# Patient Record
Sex: Female | Born: 1970 | Race: Asian | Hispanic: No | Marital: Married | Smoking: Never smoker
Health system: Southern US, Community
[De-identification: ages and names within clinical notes are randomized; demographics above are authoritative.]

---

## 1998-06-14 ENCOUNTER — Encounter: Admission: RE | Admit: 1998-06-14 | Discharge: 1998-06-14 | Payer: Self-pay | Admitting: Family Medicine

## 1998-11-16 ENCOUNTER — Encounter: Admission: RE | Admit: 1998-11-16 | Discharge: 1998-11-16 | Payer: Self-pay | Admitting: Family Medicine

## 1999-02-04 ENCOUNTER — Encounter: Payer: Self-pay | Admitting: Orthopedic Surgery

## 1999-02-04 ENCOUNTER — Ambulatory Visit (HOSPITAL_COMMUNITY): Admission: RE | Admit: 1999-02-04 | Discharge: 1999-02-04 | Payer: Self-pay | Admitting: Orthopedic Surgery

## 1999-03-12 ENCOUNTER — Encounter: Admission: RE | Admit: 1999-03-12 | Discharge: 1999-03-12 | Payer: Self-pay | Admitting: Sports Medicine

## 1999-03-13 ENCOUNTER — Encounter: Admission: RE | Admit: 1999-03-13 | Discharge: 1999-03-13 | Payer: Self-pay | Admitting: Family Medicine

## 1999-03-26 ENCOUNTER — Encounter: Admission: RE | Admit: 1999-03-26 | Discharge: 1999-03-26 | Payer: Self-pay | Admitting: Family Medicine

## 1999-05-09 ENCOUNTER — Encounter: Admission: RE | Admit: 1999-05-09 | Discharge: 1999-05-09 | Payer: Self-pay | Admitting: Family Medicine

## 1999-05-21 ENCOUNTER — Encounter: Admission: RE | Admit: 1999-05-21 | Discharge: 1999-05-21 | Payer: Self-pay | Admitting: Sports Medicine

## 1999-06-14 ENCOUNTER — Encounter: Admission: RE | Admit: 1999-06-14 | Discharge: 1999-06-14 | Payer: Self-pay | Admitting: Family Medicine

## 1999-08-26 ENCOUNTER — Encounter: Admission: RE | Admit: 1999-08-26 | Discharge: 1999-08-26 | Payer: Self-pay | Admitting: Family Medicine

## 1999-12-25 ENCOUNTER — Other Ambulatory Visit: Admission: RE | Admit: 1999-12-25 | Discharge: 1999-12-25 | Payer: Self-pay | Admitting: Obstetrics and Gynecology

## 2000-02-17 ENCOUNTER — Other Ambulatory Visit: Admission: RE | Admit: 2000-02-17 | Discharge: 2000-02-17 | Payer: Self-pay | Admitting: Obstetrics and Gynecology

## 2000-02-17 ENCOUNTER — Encounter (INDEPENDENT_AMBULATORY_CARE_PROVIDER_SITE_OTHER): Payer: Self-pay

## 2000-06-10 ENCOUNTER — Encounter: Admission: RE | Admit: 2000-06-10 | Discharge: 2000-06-10 | Payer: Self-pay | Admitting: Family Medicine

## 2000-06-13 ENCOUNTER — Emergency Department (HOSPITAL_COMMUNITY): Admission: EM | Admit: 2000-06-13 | Discharge: 2000-06-13 | Payer: Self-pay | Admitting: Emergency Medicine

## 2000-06-13 ENCOUNTER — Encounter: Payer: Self-pay | Admitting: Emergency Medicine

## 2000-06-22 ENCOUNTER — Encounter: Admission: RE | Admit: 2000-06-22 | Discharge: 2000-08-07 | Payer: Self-pay | Admitting: Orthopaedic Surgery

## 2000-08-18 ENCOUNTER — Ambulatory Visit (HOSPITAL_COMMUNITY): Admission: RE | Admit: 2000-08-18 | Discharge: 2000-08-18 | Payer: Self-pay | Admitting: Orthopaedic Surgery

## 2000-08-18 ENCOUNTER — Encounter: Payer: Self-pay | Admitting: Orthopaedic Surgery

## 2003-09-11 ENCOUNTER — Other Ambulatory Visit: Admission: RE | Admit: 2003-09-11 | Discharge: 2003-09-11 | Payer: Self-pay | Admitting: Family Medicine

## 2004-09-16 ENCOUNTER — Other Ambulatory Visit: Admission: RE | Admit: 2004-09-16 | Discharge: 2004-09-16 | Payer: Self-pay | Admitting: Family Medicine

## 2005-10-08 ENCOUNTER — Other Ambulatory Visit: Admission: RE | Admit: 2005-10-08 | Discharge: 2005-10-08 | Payer: Self-pay | Admitting: Family Medicine

## 2006-11-17 ENCOUNTER — Other Ambulatory Visit: Admission: RE | Admit: 2006-11-17 | Discharge: 2006-11-17 | Payer: Self-pay | Admitting: Family Medicine

## 2007-12-07 ENCOUNTER — Other Ambulatory Visit: Admission: RE | Admit: 2007-12-07 | Discharge: 2007-12-07 | Payer: Self-pay | Admitting: Family Medicine

## 2008-12-28 ENCOUNTER — Other Ambulatory Visit: Admission: RE | Admit: 2008-12-28 | Discharge: 2008-12-28 | Payer: Self-pay | Admitting: Family Medicine

## 2009-03-06 ENCOUNTER — Encounter: Admission: RE | Admit: 2009-03-06 | Discharge: 2009-03-06 | Payer: Self-pay | Admitting: Family Medicine

## 2010-01-02 ENCOUNTER — Other Ambulatory Visit: Admission: RE | Admit: 2010-01-02 | Discharge: 2010-01-02 | Payer: Self-pay | Admitting: Family Medicine

## 2010-06-06 ENCOUNTER — Encounter
Admission: RE | Admit: 2010-06-06 | Discharge: 2010-06-06 | Payer: Self-pay | Source: Home / Self Care | Attending: Family Medicine | Admitting: Family Medicine

## 2010-06-29 ENCOUNTER — Encounter: Payer: Self-pay | Admitting: Family Medicine

## 2011-06-04 ENCOUNTER — Other Ambulatory Visit: Payer: Self-pay | Admitting: Family Medicine

## 2011-06-04 DIAGNOSIS — Z1231 Encounter for screening mammogram for malignant neoplasm of breast: Secondary | ICD-10-CM

## 2012-06-28 ENCOUNTER — Ambulatory Visit: Payer: Self-pay

## 2012-07-21 ENCOUNTER — Ambulatory Visit
Admission: RE | Admit: 2012-07-21 | Discharge: 2012-07-21 | Disposition: A | Discharge status: Home / Self Care | Payer: PRIVATE HEALTH INSURANCE | Source: Ambulatory Visit | Attending: Family Medicine | Admitting: Family Medicine

## 2012-07-21 DIAGNOSIS — Z1231 Encounter for screening mammogram for malignant neoplasm of breast: Secondary | ICD-10-CM

## 2013-12-02 ENCOUNTER — Other Ambulatory Visit: Payer: Self-pay

## 2013-12-02 DIAGNOSIS — Z1231 Encounter for screening mammogram for malignant neoplasm of breast: Secondary | ICD-10-CM

## 2013-12-05 ENCOUNTER — Ambulatory Visit
Admission: RE | Admit: 2013-12-05 | Discharge: 2013-12-05 | Disposition: A | Discharge status: Home / Self Care | Payer: Managed Care, Other (non HMO) | Source: Ambulatory Visit

## 2013-12-05 DIAGNOSIS — Z1231 Encounter for screening mammogram for malignant neoplasm of breast: Secondary | ICD-10-CM

## 2014-11-16 ENCOUNTER — Other Ambulatory Visit: Payer: Self-pay

## 2014-11-16 DIAGNOSIS — Z1231 Encounter for screening mammogram for malignant neoplasm of breast: Secondary | ICD-10-CM

## 2014-12-12 ENCOUNTER — Ambulatory Visit: Payer: Managed Care, Other (non HMO)

## 2014-12-15 ENCOUNTER — Ambulatory Visit
Admission: RE | Admit: 2014-12-15 | Discharge: 2014-12-15 | Disposition: A | Discharge status: Home / Self Care | Payer: Managed Care, Other (non HMO) | Source: Ambulatory Visit

## 2014-12-15 DIAGNOSIS — Z1231 Encounter for screening mammogram for malignant neoplasm of breast: Secondary | ICD-10-CM

## 2015-01-18 ENCOUNTER — Other Ambulatory Visit: Payer: Self-pay | Admitting: Physician Assistant

## 2015-01-18 ENCOUNTER — Other Ambulatory Visit (HOSPITAL_COMMUNITY)
Admission: RE | Admit: 2015-01-18 | Discharge: 2015-01-18 | Disposition: A | Discharge status: Home / Self Care | Payer: Managed Care, Other (non HMO) | Source: Ambulatory Visit | Attending: Physician Assistant | Admitting: Physician Assistant

## 2015-01-18 DIAGNOSIS — Z1151 Encounter for screening for human papillomavirus (HPV): Secondary | ICD-10-CM | POA: Diagnosis present

## 2015-01-18 DIAGNOSIS — N760 Acute vaginitis: Secondary | ICD-10-CM | POA: Insufficient documentation

## 2015-01-18 DIAGNOSIS — Z01419 Encounter for gynecological examination (general) (routine) without abnormal findings: Secondary | ICD-10-CM | POA: Diagnosis present

## 2015-01-22 LAB — CYTOLOGY - PAP

## 2016-03-11 ENCOUNTER — Other Ambulatory Visit: Payer: Self-pay | Admitting: Family Medicine

## 2016-03-11 DIAGNOSIS — Z1231 Encounter for screening mammogram for malignant neoplasm of breast: Secondary | ICD-10-CM

## 2016-03-25 ENCOUNTER — Ambulatory Visit
Admission: RE | Admit: 2016-03-25 | Discharge: 2016-03-25 | Disposition: A | Discharge status: Home / Self Care | Payer: BLUE CROSS/BLUE SHIELD | Source: Ambulatory Visit | Attending: Family Medicine | Admitting: Family Medicine

## 2016-03-25 DIAGNOSIS — Z1231 Encounter for screening mammogram for malignant neoplasm of breast: Secondary | ICD-10-CM

## 2017-04-06 ENCOUNTER — Other Ambulatory Visit: Payer: Self-pay | Admitting: Family Medicine

## 2017-04-06 DIAGNOSIS — Z1231 Encounter for screening mammogram for malignant neoplasm of breast: Secondary | ICD-10-CM

## 2017-04-08 ENCOUNTER — Ambulatory Visit
Admission: RE | Admit: 2017-04-08 | Discharge: 2017-04-08 | Disposition: A | Discharge status: Home / Self Care | Payer: BLUE CROSS/BLUE SHIELD | Source: Ambulatory Visit | Attending: Family Medicine | Admitting: Family Medicine

## 2017-04-08 DIAGNOSIS — Z1231 Encounter for screening mammogram for malignant neoplasm of breast: Secondary | ICD-10-CM

## 2018-01-14 DIAGNOSIS — L819 Disorder of pigmentation, unspecified: Secondary | ICD-10-CM | POA: Diagnosis not present

## 2018-03-10 ENCOUNTER — Other Ambulatory Visit: Payer: Self-pay | Admitting: Family Medicine

## 2018-03-10 DIAGNOSIS — Z1231 Encounter for screening mammogram for malignant neoplasm of breast: Secondary | ICD-10-CM

## 2018-04-16 ENCOUNTER — Ambulatory Visit
Admission: RE | Admit: 2018-04-16 | Discharge: 2018-04-16 | Disposition: A | Discharge status: Home / Self Care | Payer: BLUE CROSS/BLUE SHIELD | Source: Ambulatory Visit | Attending: Family Medicine | Admitting: Family Medicine

## 2018-04-16 DIAGNOSIS — Z1231 Encounter for screening mammogram for malignant neoplasm of breast: Secondary | ICD-10-CM | POA: Diagnosis not present

## 2018-04-19 ENCOUNTER — Other Ambulatory Visit: Payer: Self-pay | Admitting: Family Medicine

## 2018-04-19 DIAGNOSIS — R928 Other abnormal and inconclusive findings on diagnostic imaging of breast: Secondary | ICD-10-CM

## 2018-04-23 ENCOUNTER — Ambulatory Visit
Admission: RE | Admit: 2018-04-23 | Discharge: 2018-04-23 | Disposition: A | Discharge status: Home / Self Care | Payer: BLUE CROSS/BLUE SHIELD | Source: Ambulatory Visit | Attending: Family Medicine | Admitting: Family Medicine

## 2018-04-23 ENCOUNTER — Ambulatory Visit: Payer: BLUE CROSS/BLUE SHIELD

## 2018-04-23 DIAGNOSIS — R922 Inconclusive mammogram: Secondary | ICD-10-CM | POA: Diagnosis not present

## 2018-04-23 DIAGNOSIS — R928 Other abnormal and inconclusive findings on diagnostic imaging of breast: Secondary | ICD-10-CM

## 2018-09-10 ENCOUNTER — Other Ambulatory Visit (HOSPITAL_COMMUNITY)
Admission: RE | Admit: 2018-09-10 | Discharge: 2018-09-10 | Disposition: A | Discharge status: Home / Self Care | Payer: BLUE CROSS/BLUE SHIELD | Source: Ambulatory Visit | Attending: Family Medicine | Admitting: Family Medicine

## 2018-09-10 ENCOUNTER — Other Ambulatory Visit: Payer: Self-pay | Admitting: Family Medicine

## 2018-09-10 DIAGNOSIS — Z114 Encounter for screening for human immunodeficiency virus [HIV]: Secondary | ICD-10-CM | POA: Diagnosis not present

## 2018-09-10 DIAGNOSIS — E559 Vitamin D deficiency, unspecified: Secondary | ICD-10-CM | POA: Diagnosis not present

## 2018-09-10 DIAGNOSIS — Z13220 Encounter for screening for lipoid disorders: Secondary | ICD-10-CM | POA: Diagnosis not present

## 2018-09-10 DIAGNOSIS — Z124 Encounter for screening for malignant neoplasm of cervix: Secondary | ICD-10-CM | POA: Insufficient documentation

## 2018-09-10 DIAGNOSIS — Z0000 Encounter for general adult medical examination without abnormal findings: Secondary | ICD-10-CM | POA: Diagnosis not present

## 2018-09-10 DIAGNOSIS — Z1159 Encounter for screening for other viral diseases: Secondary | ICD-10-CM | POA: Diagnosis not present

## 2018-09-15 LAB — CYTOLOGY - PAP
Diagnosis: NEGATIVE
HPV: NOT DETECTED

## 2018-09-24 DIAGNOSIS — D649 Anemia, unspecified: Secondary | ICD-10-CM | POA: Diagnosis not present

## 2019-05-04 ENCOUNTER — Other Ambulatory Visit: Payer: Self-pay

## 2019-05-04 DIAGNOSIS — Z20822 Contact with and (suspected) exposure to covid-19: Secondary | ICD-10-CM

## 2019-05-09 DIAGNOSIS — Z20828 Contact with and (suspected) exposure to other viral communicable diseases: Secondary | ICD-10-CM | POA: Diagnosis not present

## 2019-07-06 DIAGNOSIS — R5383 Other fatigue: Secondary | ICD-10-CM | POA: Diagnosis not present

## 2019-07-06 DIAGNOSIS — U071 COVID-19: Secondary | ICD-10-CM | POA: Diagnosis not present

## 2019-09-21 ENCOUNTER — Other Ambulatory Visit: Payer: Self-pay | Admitting: Family Medicine

## 2019-09-21 DIAGNOSIS — Z0000 Encounter for general adult medical examination without abnormal findings: Secondary | ICD-10-CM | POA: Diagnosis not present

## 2019-09-21 DIAGNOSIS — Z1231 Encounter for screening mammogram for malignant neoplasm of breast: Secondary | ICD-10-CM

## 2019-10-07 ENCOUNTER — Ambulatory Visit: Payer: BLUE CROSS/BLUE SHIELD

## 2019-10-14 ENCOUNTER — Other Ambulatory Visit: Payer: Self-pay

## 2019-10-14 ENCOUNTER — Ambulatory Visit
Admission: RE | Admit: 2019-10-14 | Discharge: 2019-10-14 | Disposition: A | Discharge status: Home / Self Care | Payer: BC Managed Care – PPO | Source: Ambulatory Visit | Attending: Family Medicine | Admitting: Family Medicine

## 2019-10-14 DIAGNOSIS — Z1231 Encounter for screening mammogram for malignant neoplasm of breast: Secondary | ICD-10-CM | POA: Diagnosis not present

## 2019-12-03 ENCOUNTER — Encounter (HOSPITAL_COMMUNITY): Payer: Self-pay

## 2019-12-03 ENCOUNTER — Ambulatory Visit (HOSPITAL_COMMUNITY)
Admission: EM | Admit: 2019-12-03 | Discharge: 2019-12-03 | Disposition: A | Discharge status: Home / Self Care | Payer: BC Managed Care – PPO | Attending: Emergency Medicine | Admitting: Emergency Medicine

## 2019-12-03 DIAGNOSIS — S161XXA Strain of muscle, fascia and tendon at neck level, initial encounter: Secondary | ICD-10-CM | POA: Diagnosis not present

## 2019-12-03 MED ORDER — PREDNISONE 10 MG PO TABS: ORAL_TABLET | ORAL | 0 refills | Status: AC

## 2019-12-03 MED ORDER — CYCLOBENZAPRINE HCL 5 MG PO TABS: 5.0000 mg | ORAL_TABLET | Freq: Two times a day (BID) | ORAL | 0 refills | Status: AC | PRN

## 2019-12-03 NOTE — Discharge Instructions (Signed)
Begin prednisone taper over the next 6 days-begin with 6 tablets on day 1, decrease by 1 tablet until complete-6, 5, 4, 3, 2, 1-take with food and in the morning if you are able  You may use flexeril as needed to help with pain. This is a muscle relaxer and causes sedation- please use only at bedtime or when you will be home and not have to drive/work  May continue using heating pad Gentle stretching to work on regaining full movement of neck  Please follow-up if any symptoms not improving or worsening

## 2019-12-03 NOTE — ED Triage Notes (Signed)
Pt present stiff neck pain, symptoms started on Tuesday night. 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

## 2019-12-03 NOTE — ED Provider Notes (Signed)
[facility]    CSN: [id number] Arrival date & time: 12/03/19  1024      History   Chief Complaint Chief Complaint  Patient presents with  . Neck Pain    HPI Gabriella Dunn is a 49 y.o. female no significant past medical history presenting today for evaluation of neck pain.  Patient has had neck pain for approximately 1 week.  Symptoms began after heavy lifting at work, but denies any specific accident injury or trauma.  Slightly improved since earlier this week.  Denies any numbness or think tingling.  Has had some headaches as well as some mild dizziness, but attributes this to lack of sleep.  She has taken Aleve and use heating pads without relief.   HPI  History reviewed. No pertinent past medical history.  There are no problems to display for this patient.   History reviewed. No pertinent surgical history.  OB History   No obstetric history on file.      Home Medications    Prior to Admission medications   Medication Sig Start Date End Date Taking? Authorizing Provider  cyclobenzaprine (FLEXERIL) 5 MG tablet Take 1-2 tablets (5-10 mg total) by mouth 2 (two) times daily as needed for muscle spasms. 12/03/19   Omero Kowal C, PA-C  predniSONE (DELTASONE) 10 MG tablet Begin with 6 tabs on day 1, 5 tab on day 2, 4 tab on day 3, 3 tab on day 4, 2 tab on day 5, 1 tab on day 6-take with food 12/03/19   August Gosser, [place] C, PA-C    Family History Family History  Problem Relation Age of Onset  . Breast cancer Neg Hx     Social History Social History   Tobacco Use  . Smoking status: Never Smoker  . Smokeless tobacco: Never Used  Substance Use Topics  . Alcohol use: Not on file  . Drug use: Not on file     Allergies   Patient has no known allergies.   Review of Systems Review of Systems  Constitutional: Negative for fatigue and fever.  HENT: Negative for congestion, sinus pressure and sore throat.   Eyes: Negative for photophobia, pain and visual  disturbance.  Respiratory: Negative for cough and shortness of breath.   Cardiovascular: Negative for chest pain.  Gastrointestinal: Negative for abdominal pain, nausea and vomiting.  Genitourinary: Negative for decreased urine volume and hematuria.  Musculoskeletal: Positive for neck pain and neck stiffness. Negative for myalgias.  Neurological: Positive for headaches. Negative for dizziness, syncope, facial asymmetry, speech difficulty, weakness, light-headedness and numbness.     Physical Exam Triage Vital Signs ED Triage Vitals  Enc Vitals Group     BP 12/03/19 1132 117/77     Pulse Rate 12/03/19 1132 62     Resp 12/03/19 1132 18     Temp 12/03/19 1132 98.5 F (36.9 C)     Temp Source 12/03/19 1132 Oral     SpO2 12/03/19 1132 100 %     Weight --      Height --      Head Circumference --      Peak Flow --      Pain Score 12/03/19 1133 10     Pain Loc --      Pain Edu? --      Excl. in [place]? --    No data found.  Updated Vital Signs BP 117/77 (BP Location: Right Arm)   Pulse 62   Temp 98.5 F (36.9 C) (  Oral)   Resp 18   SpO2 100%   Visual Acuity Right Eye Distance:   Left Eye Distance:   Bilateral Distance:    Right Eye Near:   Left Eye Near:    Bilateral Near:     Physical Exam Vitals and nursing note reviewed.  Constitutional:      Appearance: She is well-developed.     Comments: No acute distress  HENT:     Head: Normocephalic and atraumatic.     Ears:     Comments: Bilateral ears without tenderness to palpation of external auricle, tragus and mastoid, EAC's without erythema or swelling, TM's with good bony landmarks and cone of light. Non erythematous.     Nose: Nose normal.  Eyes:     Extraocular Movements: Extraocular movements intact.     Conjunctiva/sclera: Conjunctivae normal.     Pupils: Pupils are equal, round, and reactive to light.  Cardiovascular:     Rate and Rhythm: Normal rate.  Pulmonary:     Effort: Pulmonary effort is normal. No  respiratory distress.  Abdominal:     General: There is no distension.  Musculoskeletal:        General: Normal range of motion.     Cervical back: Neck supple.     Comments: Nontender to palpation of cervical spine midline, mild tenderness to palpation throughout bilateral superior trapezius areas and cervical musculature bilaterally  Full active range of motion with rightward rotation, limited leftward rotation, full flexion and extension  Skin:    General: Skin is warm and dry.  Neurological:     Mental Status: She is alert and oriented to person, place, and time.      UC Treatments / Results  Labs (all labs ordered are listed, but only abnormal results are displayed) Labs Reviewed - No data to display  EKG   Radiology No results found.  Procedures Procedures (including critical care time)  Medications Ordered in UC Medications - No data to display  Initial Impression / Assessment and Plan / UC Course  I have reviewed the triage vital signs and the nursing notes.  Pertinent labs & imaging results that were available during my care of the patient were reviewed by me and considered in my medical decision making (see chart for details).    Cervical strain- No mechanism of injury, suspect most likely straining from heavy lifting.  Will provide course of prednisone given using NSAIDs without relief along with muscle relaxers and continuing gentle stretching.  Monitor for gradual improvement,Discussed strict return precautions. Patient verbalized understanding and is agreeable with plan.  Final Clinical Impressions(s) / UC Diagnoses   Final diagnoses:  Acute strain of neck muscle, initial encounter     Discharge Instructions     Begin prednisone taper over the next 6 days-begin with 6 tablets on day 1, decrease by 1 tablet until complete-6, 5, 4, 3, 2, 1-take with food and in the morning if you are able  You may use flexeril as needed to help with pain. This is a  muscle relaxer and causes sedation- please use only at bedtime or when you will be home and not have to drive/work  May continue using heating pad Gentle stretching to work on regaining full movement of neck  Please follow-up if any symptoms not improving or worsening   ED Prescriptions    Medication Sig Dispense Auth. Provider   predniSONE (DELTASONE) 10 MG tablet Begin with 6 tabs on day 1, 5 tab on day  2, 4 tab on day 3, 3 tab on day 4, 2 tab on day 5, 1 tab on day 6-take with food 21 tablet Rossanna Spitzley C, PA-C   cyclobenzaprine (FLEXERIL) 5 MG tablet Take 1-2 tablets (5-10 mg total) by mouth 2 (two) times daily as needed for muscle spasms. 24 tablet Franchon Ketterman, [place] C, PA-C     PDMP not reviewed this encounter.   Tilton Marsalis, [place] C, PA-C 12/03/19 1226

## 2019-12-14 DIAGNOSIS — Z1159 Encounter for screening for other viral diseases: Secondary | ICD-10-CM | POA: Diagnosis not present

## 2019-12-14 DIAGNOSIS — M5412 Radiculopathy, cervical region: Secondary | ICD-10-CM | POA: Diagnosis not present

## 2019-12-19 DIAGNOSIS — Z1211 Encounter for screening for malignant neoplasm of colon: Secondary | ICD-10-CM | POA: Diagnosis not present

## 2019-12-19 DIAGNOSIS — K6389 Other specified diseases of intestine: Secondary | ICD-10-CM | POA: Diagnosis not present

## 2019-12-19 DIAGNOSIS — K635 Polyp of colon: Secondary | ICD-10-CM | POA: Diagnosis not present

## 2019-12-20 ENCOUNTER — Other Ambulatory Visit: Payer: Self-pay | Admitting: Family Medicine

## 2019-12-20 ENCOUNTER — Ambulatory Visit: Payer: Self-pay

## 2019-12-20 ENCOUNTER — Other Ambulatory Visit: Payer: Self-pay

## 2019-12-20 DIAGNOSIS — M542 Cervicalgia: Secondary | ICD-10-CM

## 2020-06-11 DIAGNOSIS — Z20828 Contact with and (suspected) exposure to other viral communicable diseases: Secondary | ICD-10-CM | POA: Diagnosis not present

## 2020-06-12 DIAGNOSIS — Z20828 Contact with and (suspected) exposure to other viral communicable diseases: Secondary | ICD-10-CM | POA: Diagnosis not present

## 2020-10-08 ENCOUNTER — Other Ambulatory Visit: Payer: Self-pay | Admitting: Family Medicine

## 2020-10-08 DIAGNOSIS — Z1231 Encounter for screening mammogram for malignant neoplasm of breast: Secondary | ICD-10-CM

## 2020-11-30 ENCOUNTER — Ambulatory Visit
Admission: RE | Admit: 2020-11-30 | Discharge: 2020-11-30 | Disposition: A | Discharge status: Home / Self Care | Payer: BC Managed Care – PPO | Source: Ambulatory Visit | Attending: Family Medicine | Admitting: Family Medicine

## 2020-11-30 ENCOUNTER — Other Ambulatory Visit: Payer: Self-pay

## 2020-11-30 DIAGNOSIS — Z1231 Encounter for screening mammogram for malignant neoplasm of breast: Secondary | ICD-10-CM | POA: Diagnosis not present

## 2021-06-04 DIAGNOSIS — D2339 Other benign neoplasm of skin of other parts of face: Secondary | ICD-10-CM | POA: Diagnosis not present

## 2021-06-04 DIAGNOSIS — D1801 Hemangioma of skin and subcutaneous tissue: Secondary | ICD-10-CM | POA: Diagnosis not present

## 2021-06-04 DIAGNOSIS — D225 Melanocytic nevi of trunk: Secondary | ICD-10-CM | POA: Diagnosis not present

## 2021-06-04 DIAGNOSIS — L821 Other seborrheic keratosis: Secondary | ICD-10-CM | POA: Diagnosis not present

## 2021-06-04 DIAGNOSIS — D485 Neoplasm of uncertain behavior of skin: Secondary | ICD-10-CM | POA: Diagnosis not present

## 2021-06-04 DIAGNOSIS — D2272 Melanocytic nevi of left lower limb, including hip: Secondary | ICD-10-CM | POA: Diagnosis not present

## 2021-08-12 IMAGING — MG MM DIGITAL SCREENING BILAT W/ TOMO AND CAD
8 series · 9 of 24 positions shown · non-contrast
Comparison: Previous exam(s).

CLINICAL DATA: Screening.

EXAM:
DIGITAL SCREENING BILATERAL MAMMOGRAM WITH TOMOSYNTHESIS AND CAD
TECHNIQUE: Bilateral screening digital craniocaudal and mediolateral oblique
mammograms were obtained. Bilateral screening digital breast
tomosynthesis was performed. The images were evaluated with
computer-aided detection.

[R CC synth-2D]
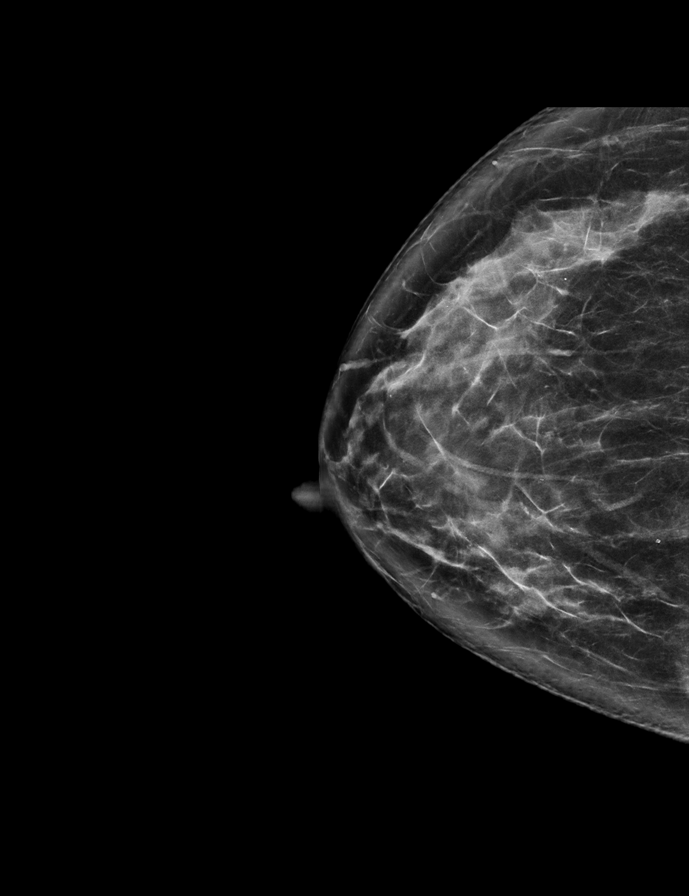

[R MLO synth-2D]
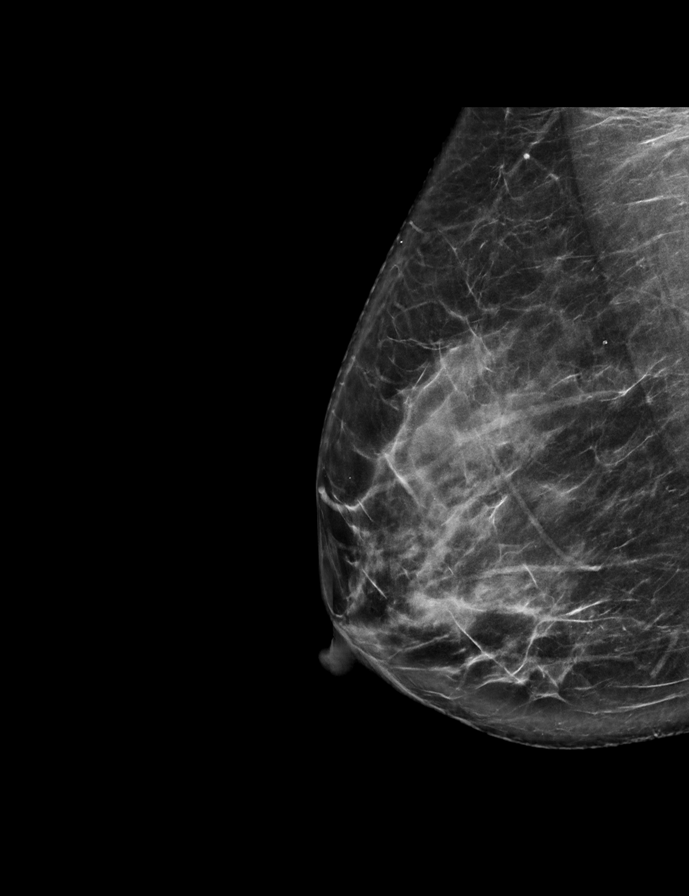

[L CC synth-2D]
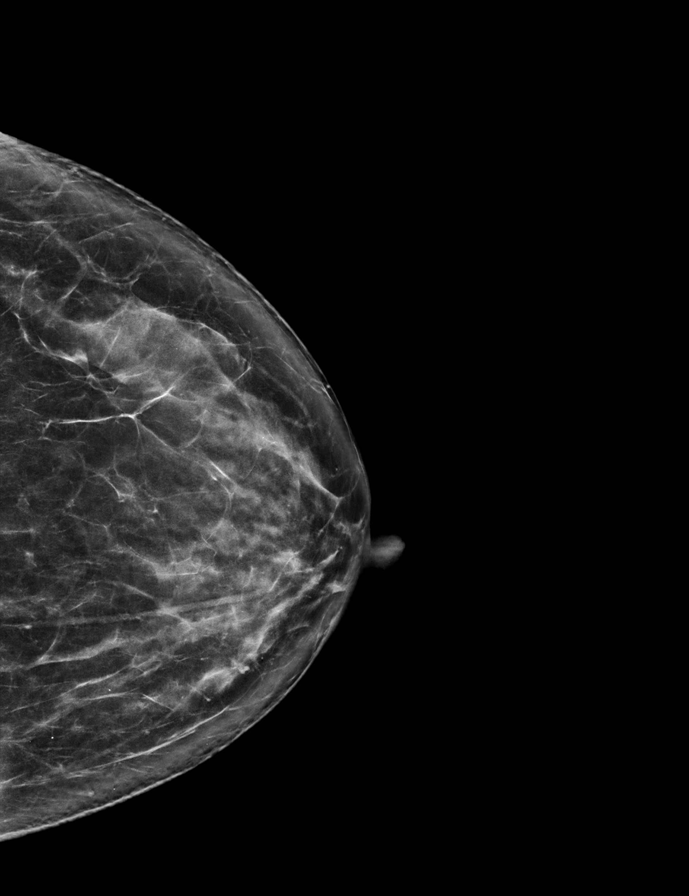

[L MLO synth-2D]
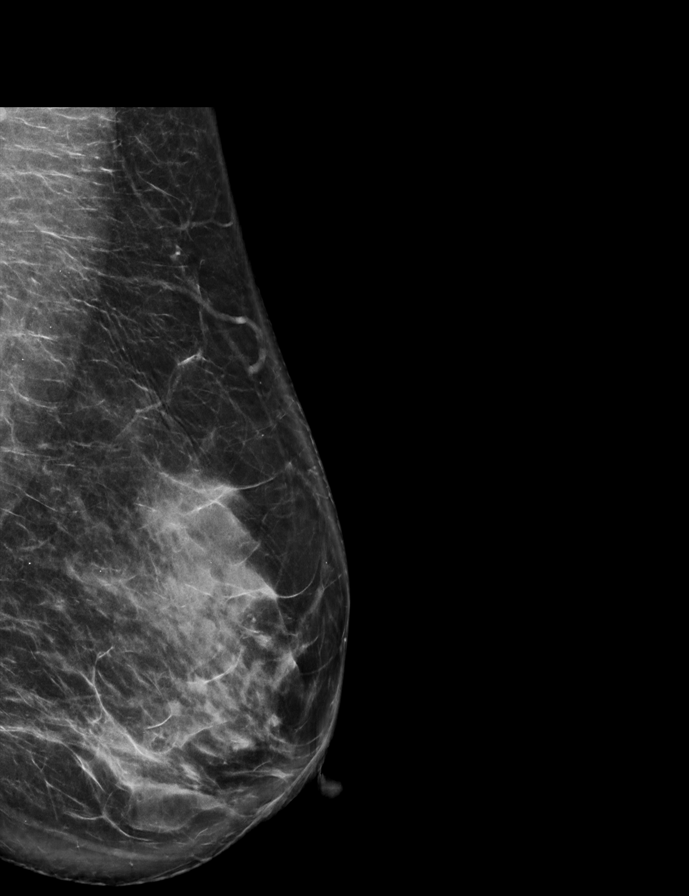

[R MLO tomo · 2 of 73 frames shown]
[frame 24/73]
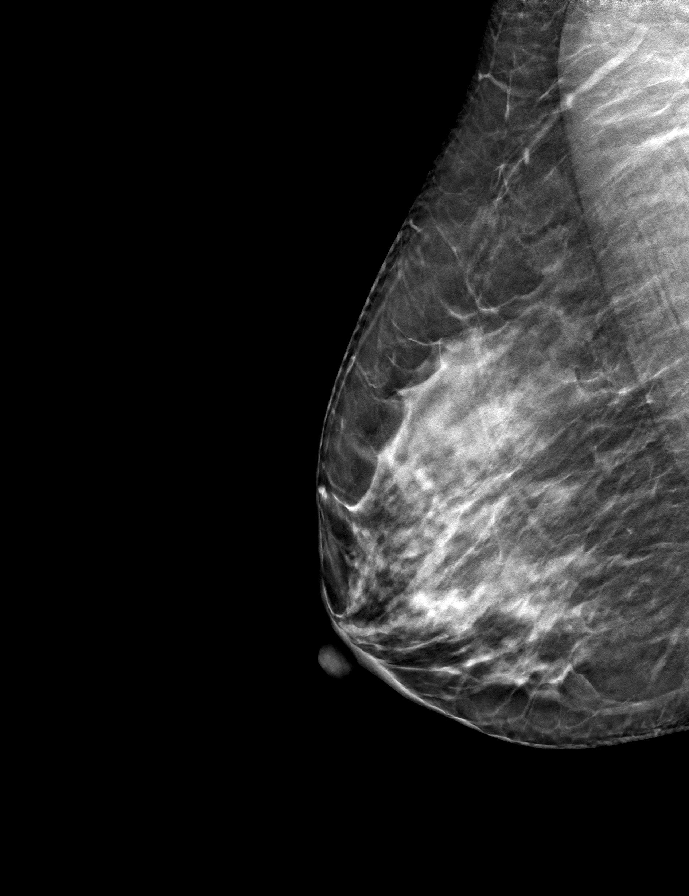
[frame 37/73]
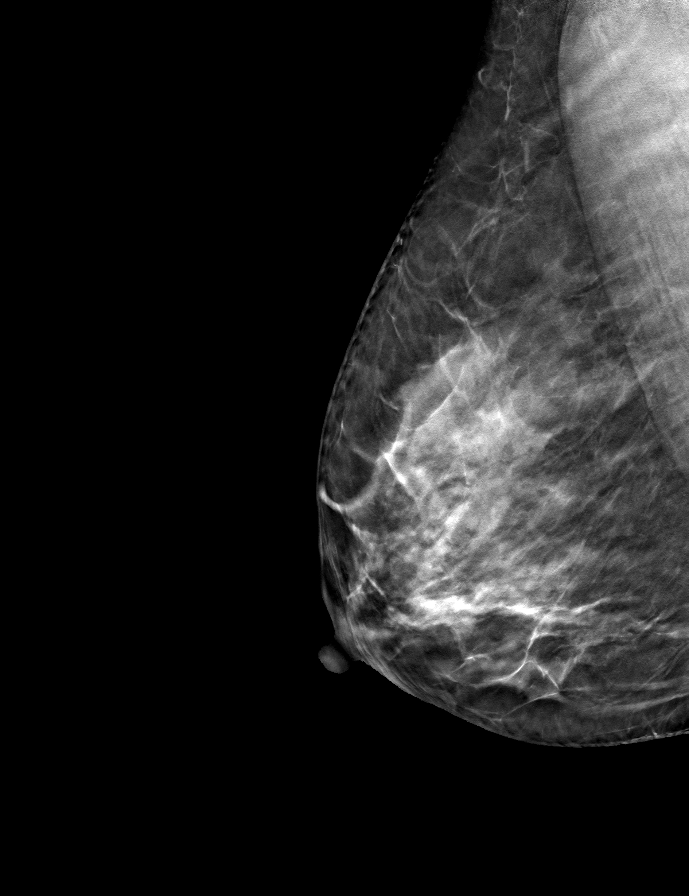

[L MLO tomo · tomo slice 41/80.0]
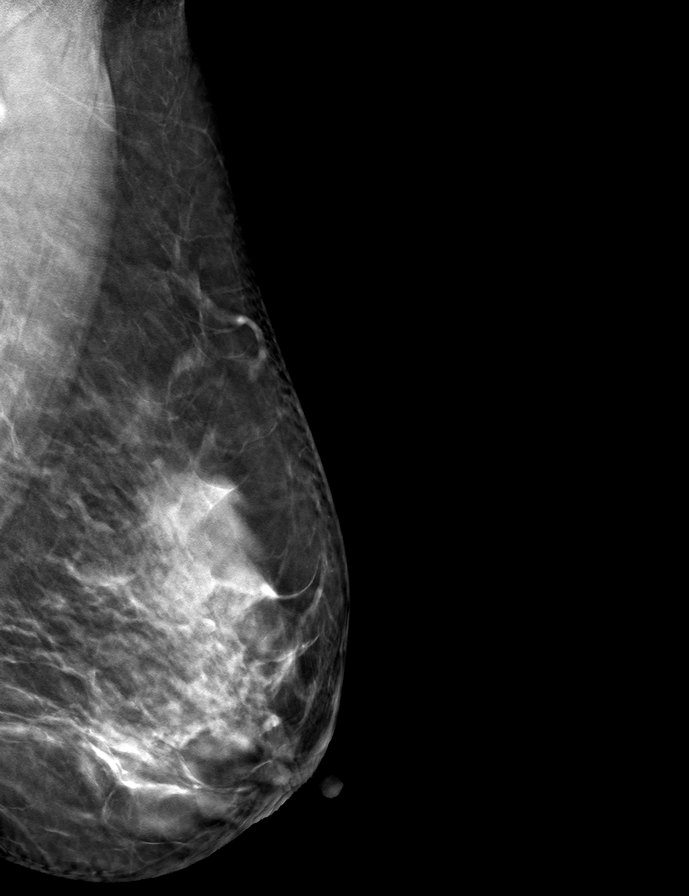

[R CC tomo · tomo slice 36/71.0]
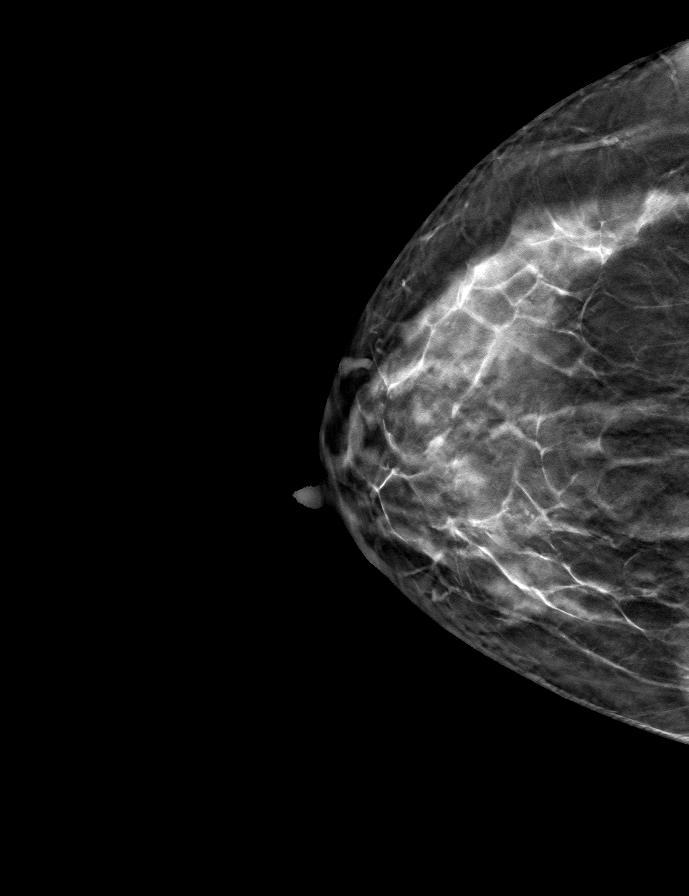

[L CC tomo · tomo slice 35/69.0]
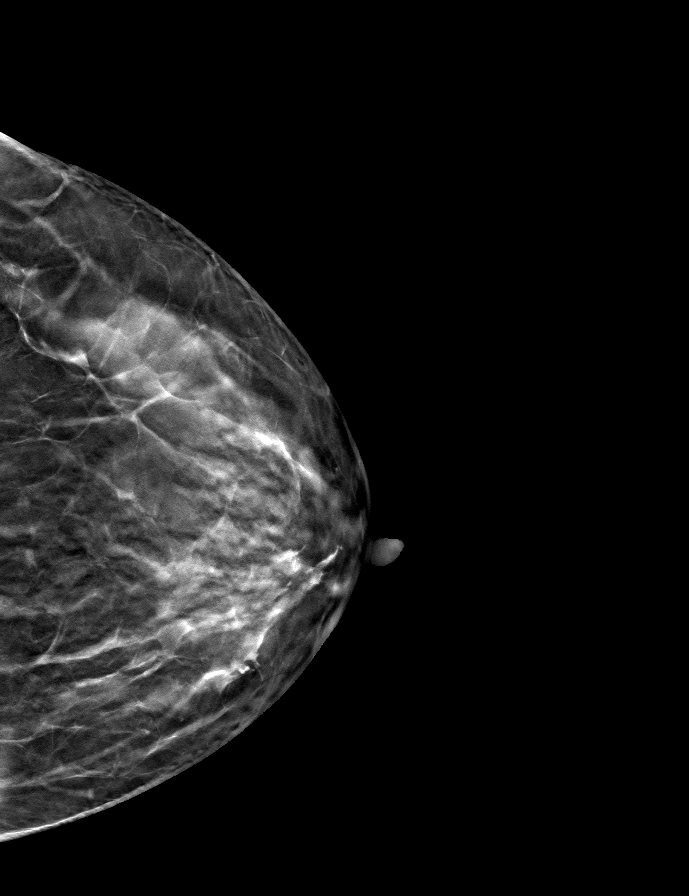

[9 of 24 positions shown; findings below may reference images not displayed]

ACR Breast Density Category c: The breast tissue is heterogeneously
dense, which may obscure small masses.
FINDINGS: There are no findings suspicious for malignancy.
IMPRESSION: No mammographic evidence of malignancy. A result letter of this
screening mammogram will be mailed directly to the patient.

RECOMMENDATION:
Screening mammogram in one year. (Code:Q3-W-BC3)

BI-RADS CATEGORY  1: Negative.

## 2021-11-06 DIAGNOSIS — N912 Amenorrhea, unspecified: Secondary | ICD-10-CM | POA: Diagnosis not present

## 2021-11-06 DIAGNOSIS — Z124 Encounter for screening for malignant neoplasm of cervix: Secondary | ICD-10-CM | POA: Diagnosis not present

## 2022-01-22 ENCOUNTER — Other Ambulatory Visit: Payer: Self-pay | Admitting: Family Medicine

## 2022-01-22 DIAGNOSIS — Z1231 Encounter for screening mammogram for malignant neoplasm of breast: Secondary | ICD-10-CM

## 2022-01-29 ENCOUNTER — Ambulatory Visit
Admission: RE | Admit: 2022-01-29 | Discharge: 2022-01-29 | Disposition: A | Discharge status: Home / Self Care | Payer: BC Managed Care – PPO | Source: Ambulatory Visit | Attending: Family Medicine | Admitting: Family Medicine

## 2022-01-29 DIAGNOSIS — Z1231 Encounter for screening mammogram for malignant neoplasm of breast: Secondary | ICD-10-CM | POA: Diagnosis not present

## 2022-05-07 DIAGNOSIS — Z0000 Encounter for general adult medical examination without abnormal findings: Secondary | ICD-10-CM | POA: Diagnosis not present

## 2022-05-07 DIAGNOSIS — L309 Dermatitis, unspecified: Secondary | ICD-10-CM | POA: Diagnosis not present

## 2022-05-07 DIAGNOSIS — M25561 Pain in right knee: Secondary | ICD-10-CM | POA: Diagnosis not present

## 2022-05-07 DIAGNOSIS — B079 Viral wart, unspecified: Secondary | ICD-10-CM | POA: Diagnosis not present

## 2022-05-07 DIAGNOSIS — Z13220 Encounter for screening for lipoid disorders: Secondary | ICD-10-CM | POA: Diagnosis not present

## 2022-05-08 ENCOUNTER — Ambulatory Visit
Admission: RE | Admit: 2022-05-08 | Discharge: 2022-05-08 | Disposition: A | Discharge status: Home / Self Care | Payer: BC Managed Care – PPO | Source: Ambulatory Visit | Attending: Sports Medicine | Admitting: Sports Medicine

## 2022-05-08 ENCOUNTER — Other Ambulatory Visit: Payer: Self-pay | Admitting: Sports Medicine

## 2022-05-08 DIAGNOSIS — M25561 Pain in right knee: Secondary | ICD-10-CM | POA: Diagnosis not present

## 2022-05-08 DIAGNOSIS — M545 Low back pain, unspecified: Secondary | ICD-10-CM | POA: Diagnosis not present

## 2022-06-05 DIAGNOSIS — M542 Cervicalgia: Secondary | ICD-10-CM | POA: Diagnosis not present

## 2022-06-11 DIAGNOSIS — L03011 Cellulitis of right finger: Secondary | ICD-10-CM | POA: Diagnosis not present

## 2022-06-11 DIAGNOSIS — B078 Other viral warts: Secondary | ICD-10-CM | POA: Diagnosis not present

## 2022-06-11 DIAGNOSIS — L738 Other specified follicular disorders: Secondary | ICD-10-CM | POA: Diagnosis not present

## 2022-07-02 DIAGNOSIS — B078 Other viral warts: Secondary | ICD-10-CM | POA: Diagnosis not present

## 2023-02-27 ENCOUNTER — Other Ambulatory Visit: Payer: Self-pay | Admitting: Family Medicine

## 2023-02-27 DIAGNOSIS — Z1231 Encounter for screening mammogram for malignant neoplasm of breast: Secondary | ICD-10-CM

## 2023-03-25 ENCOUNTER — Ambulatory Visit
Admission: RE | Admit: 2023-03-25 | Discharge: 2023-03-25 | Disposition: A | Discharge status: Home / Self Care | Payer: BC Managed Care – PPO | Source: Ambulatory Visit | Attending: Family Medicine | Admitting: Family Medicine

## 2023-03-25 DIAGNOSIS — Z1231 Encounter for screening mammogram for malignant neoplasm of breast: Secondary | ICD-10-CM | POA: Diagnosis not present

## 2023-10-09 DIAGNOSIS — R222 Localized swelling, mass and lump, trunk: Secondary | ICD-10-CM | POA: Diagnosis not present

## 2023-10-09 DIAGNOSIS — Z0000 Encounter for general adult medical examination without abnormal findings: Secondary | ICD-10-CM | POA: Diagnosis not present

## 2023-10-09 DIAGNOSIS — M150 Primary generalized (osteo)arthritis: Secondary | ICD-10-CM | POA: Diagnosis not present

## 2023-10-09 DIAGNOSIS — Z13220 Encounter for screening for lipoid disorders: Secondary | ICD-10-CM | POA: Diagnosis not present

## 2023-10-09 DIAGNOSIS — M21622 Bunionette of left foot: Secondary | ICD-10-CM | POA: Diagnosis not present

## 2024-01-01 DIAGNOSIS — M5412 Radiculopathy, cervical region: Secondary | ICD-10-CM | POA: Diagnosis not present

## 2024-01-01 DIAGNOSIS — M25511 Pain in right shoulder: Secondary | ICD-10-CM | POA: Diagnosis not present

## 2024-01-13 DIAGNOSIS — G8929 Other chronic pain: Secondary | ICD-10-CM | POA: Diagnosis not present

## 2024-01-13 DIAGNOSIS — M25511 Pain in right shoulder: Secondary | ICD-10-CM | POA: Diagnosis not present

## 2024-01-13 DIAGNOSIS — M5412 Radiculopathy, cervical region: Secondary | ICD-10-CM | POA: Diagnosis not present

## 2024-01-28 DIAGNOSIS — G8929 Other chronic pain: Secondary | ICD-10-CM | POA: Diagnosis not present

## 2024-01-28 DIAGNOSIS — M25511 Pain in right shoulder: Secondary | ICD-10-CM | POA: Diagnosis not present

## 2024-01-28 DIAGNOSIS — M5412 Radiculopathy, cervical region: Secondary | ICD-10-CM | POA: Diagnosis not present

## 2024-02-05 DIAGNOSIS — G8929 Other chronic pain: Secondary | ICD-10-CM | POA: Diagnosis not present

## 2024-02-05 DIAGNOSIS — M25511 Pain in right shoulder: Secondary | ICD-10-CM | POA: Diagnosis not present

## 2024-02-05 DIAGNOSIS — M5412 Radiculopathy, cervical region: Secondary | ICD-10-CM | POA: Diagnosis not present

## 2024-02-12 DIAGNOSIS — G8929 Other chronic pain: Secondary | ICD-10-CM | POA: Diagnosis not present

## 2024-02-12 DIAGNOSIS — M5412 Radiculopathy, cervical region: Secondary | ICD-10-CM | POA: Diagnosis not present

## 2024-02-12 DIAGNOSIS — M25511 Pain in right shoulder: Secondary | ICD-10-CM | POA: Diagnosis not present

## 2024-03-04 DIAGNOSIS — M5412 Radiculopathy, cervical region: Secondary | ICD-10-CM | POA: Diagnosis not present

## 2024-03-04 DIAGNOSIS — M25511 Pain in right shoulder: Secondary | ICD-10-CM | POA: Diagnosis not present

## 2024-03-25 DIAGNOSIS — M5412 Radiculopathy, cervical region: Secondary | ICD-10-CM | POA: Diagnosis not present

## 2024-03-25 DIAGNOSIS — G8929 Other chronic pain: Secondary | ICD-10-CM | POA: Diagnosis not present

## 2024-03-25 DIAGNOSIS — M25511 Pain in right shoulder: Secondary | ICD-10-CM | POA: Diagnosis not present

## 2024-04-08 DIAGNOSIS — M25511 Pain in right shoulder: Secondary | ICD-10-CM | POA: Diagnosis not present

## 2024-04-08 DIAGNOSIS — G8929 Other chronic pain: Secondary | ICD-10-CM | POA: Diagnosis not present

## 2024-04-08 DIAGNOSIS — M5412 Radiculopathy, cervical region: Secondary | ICD-10-CM | POA: Diagnosis not present

## 2024-04-15 DIAGNOSIS — M5412 Radiculopathy, cervical region: Secondary | ICD-10-CM | POA: Diagnosis not present

## 2024-04-15 DIAGNOSIS — M25511 Pain in right shoulder: Secondary | ICD-10-CM | POA: Diagnosis not present

## 2024-04-27 ENCOUNTER — Other Ambulatory Visit: Payer: Self-pay | Admitting: Family Medicine

## 2024-04-27 DIAGNOSIS — Z1231 Encounter for screening mammogram for malignant neoplasm of breast: Secondary | ICD-10-CM

## 2024-04-29 DIAGNOSIS — M25511 Pain in right shoulder: Secondary | ICD-10-CM | POA: Diagnosis not present

## 2024-04-29 DIAGNOSIS — M5412 Radiculopathy, cervical region: Secondary | ICD-10-CM | POA: Diagnosis not present

## 2024-05-25 ENCOUNTER — Inpatient Hospital Stay: Admission: RE | Admit: 2024-05-25 | Discharge: 2024-05-25 | Attending: Family Medicine | Admitting: Family Medicine

## 2024-05-25 DIAGNOSIS — Z1231 Encounter for screening mammogram for malignant neoplasm of breast: Secondary | ICD-10-CM | POA: Diagnosis not present
